# Patient Record
Sex: Male | Born: 1975 | Race: White | Hispanic: No | Marital: Married | Smoking: Current every day smoker
Health system: Southern US, Community
[De-identification: ages and names within clinical notes are randomized; demographics above are authoritative.]

---

## 2009-10-03 ENCOUNTER — Emergency Department (HOSPITAL_COMMUNITY): Admission: EM | Admit: 2009-10-03 | Discharge: 2009-10-03 | Payer: Self-pay | Admitting: Emergency Medicine

## 2010-01-30 ENCOUNTER — Emergency Department (HOSPITAL_COMMUNITY): Admission: EM | Admit: 2010-01-30 | Discharge: 2010-01-30 | Payer: Self-pay | Admitting: Emergency Medicine

## 2010-11-23 ENCOUNTER — Emergency Department (HOSPITAL_COMMUNITY)
Admission: EM | Admit: 2010-11-23 | Discharge: 2010-11-23 | Payer: Self-pay | Source: Home / Self Care | Admitting: Emergency Medicine

## 2011-01-16 ENCOUNTER — Emergency Department (HOSPITAL_COMMUNITY): Payer: Self-pay

## 2011-01-16 ENCOUNTER — Emergency Department (HOSPITAL_COMMUNITY)
Admission: EM | Admit: 2011-01-16 | Discharge: 2011-01-16 | Disposition: A | Discharge status: Home / Self Care | Payer: Self-pay | Attending: Emergency Medicine | Admitting: Emergency Medicine

## 2011-01-16 DIAGNOSIS — R209 Unspecified disturbances of skin sensation: Secondary | ICD-10-CM | POA: Insufficient documentation

## 2011-01-16 DIAGNOSIS — M79609 Pain in unspecified limb: Secondary | ICD-10-CM | POA: Insufficient documentation

## 2013-05-23 ENCOUNTER — Emergency Department (HOSPITAL_COMMUNITY)
Admission: EM | Admit: 2013-05-23 | Discharge: 2013-05-23 | Disposition: A | Discharge status: Home / Self Care | Payer: Self-pay | Attending: Emergency Medicine | Admitting: Emergency Medicine

## 2013-05-23 ENCOUNTER — Encounter (HOSPITAL_COMMUNITY): Payer: Self-pay

## 2013-05-23 DIAGNOSIS — L03113 Cellulitis of right upper limb: Secondary | ICD-10-CM

## 2013-05-23 DIAGNOSIS — F17200 Nicotine dependence, unspecified, uncomplicated: Secondary | ICD-10-CM | POA: Insufficient documentation

## 2013-05-23 DIAGNOSIS — R21 Rash and other nonspecific skin eruption: Secondary | ICD-10-CM | POA: Insufficient documentation

## 2013-05-23 DIAGNOSIS — IMO0002 Reserved for concepts with insufficient information to code with codable children: Secondary | ICD-10-CM | POA: Insufficient documentation

## 2013-05-23 MED ORDER — DIPHENHYDRAMINE HCL 25 MG PO TABS: 25.0000 mg | ORAL_TABLET | Freq: Four times a day (QID) | ORAL | Status: DC

## 2013-05-23 MED ORDER — HYDROCODONE-ACETAMINOPHEN 5-325 MG PO TABS: 1.0000 | ORAL_TABLET | ORAL | Status: DC | PRN

## 2013-05-23 MED ORDER — SULFAMETHOXAZOLE-TRIMETHOPRIM 800-160 MG PO TABS: 1.0000 | ORAL_TABLET | Freq: Two times a day (BID) | ORAL | Status: DC

## 2013-05-23 NOTE — ED Provider Notes (Signed)
   History    CSN: [id number] Arrival date & time 05/23/13  0818  First MD Initiated Contact with Patient 05/23/13 [phone]     Chief Complaint  Patient presents with  . Rash   (Consider location/radiation/quality/duration/timing/severity/associated sxs/prior Treatment) Patient is a 37 y.o. male presenting with rash. The history is provided by the patient.  Rash Pain location: right forearm. Associated symptoms: no chills, no fever, no nausea, no shortness of breath, no sore throat and no vomiting    Charles White is a 37 y.o. male who presents to the ED with a rash. This is a new problem.  The rash is located on the right forearm. The rash started 3 months ago. He describes the rash as raised blistery areas. The areas are painful. He works outside and the sun makes the area worse and causes burning. He has been trying to keep the area covered when he is outside.  He has not been to his PCP for evaluation. He has tried OTC antibiotic cream without relief. He denies nausea or vomiting, fever or chills or any other problems. The area has gotten much worse and more painful. There is redness.  History reviewed. No pertinent past medical history. History reviewed. No pertinent past surgical history. No family history on file. History  Substance Use Topics  . Smoking status: Current Every Day Smoker    Types: Cigarettes  . Smokeless tobacco: Not on file  . Alcohol Use: Yes    Review of Systems  Constitutional: Negative for fever and chills.  HENT: Negative for sore throat.   Respiratory: Negative for shortness of breath.   Gastrointestinal: Negative for nausea and vomiting.  Skin: Positive for rash.  Allergic/Immunologic: Negative for immunocompromised state.  Neurological: Negative for headaches.  Psychiatric/Behavioral: The patient is not nervous/anxious.     Allergies  Review of patient's allergies indicates no known allergies.  Home Medications  No current outpatient  prescriptions on file. BP 138/103  Pulse 65  Temp(Src) 97.7 F (36.5 C) (Oral)  Resp 20  Ht 5\' 11"  (1.803 m)  Wt 240 lb (108.863 kg)  BMI 33.49 kg/m2  SpO2 99% Physical Exam  Nursing note and vitals reviewed. Constitutional: He is oriented to person, place, and time. He appears well-developed and well-nourished. No distress.  HENT:  Head: Normocephalic.  Eyes: EOM are normal.  Neck: Neck supple.  Cardiovascular: Normal rate.   Pulmonary/Chest: Effort normal.  Musculoskeletal:       Right forearm: He exhibits tenderness.       Arms: Vesicular lesions noted dorsal aspect of the right forearm. Tender on exam. There is surrounding erythema consistent with infection.  Neurological: He is alert and oriented to person, place, and time. No cranial nerve deficit.  Skin: Rash (right forearm) noted.  Psychiatric: He has a normal mood and affect. His behavior is normal.    ED Course: Dr. Patria Mane in to see the patient.  Procedures  MDM  37 y.o. male with infected skin lesions right forearm. Will treat with antibiotics and pain management and he will follow up with Dr. Margo Aye.  Discussed with the patient clinical findings and plan of care and all questioned fully answered. He will return if any problems arise.   [address] [place], [place] 05/23/13 [phone]

## 2013-05-23 NOTE — ED Notes (Signed)
Patient with no complaints at this time. Respirations even and unlabored. Skin warm/dry. Discharge instructions reviewed with patient at this time. Patient given opportunity to voice concerns/ask questions. Patient discharged at this time and left Emergency Department with steady gait.   

## 2013-05-23 NOTE — ED Notes (Signed)
Pt reports rash to right forearm for 3 months, has not been to pmd for treatment.

## 2013-05-23 NOTE — ED Provider Notes (Signed)
Medical screening examination/treatment/procedure(s) were conducted as a shared visit with non-physician practitioner(s) and myself.  I personally evaluated the patient during the encounter  Overall well-appearing.  Patient with rash for several months.  Now it appears secondarily infected on his right forearm.  Home with antibiotics.  Lyanne Co, MD 05/23/13 1045

## 2013-09-24 ENCOUNTER — Encounter (HOSPITAL_COMMUNITY): Payer: Self-pay | Admitting: Emergency Medicine

## 2013-09-24 ENCOUNTER — Emergency Department (HOSPITAL_COMMUNITY)
Admission: EM | Admit: 2013-09-24 | Discharge: 2013-09-24 | Disposition: A | Discharge status: Home / Self Care | Payer: Self-pay | Attending: Emergency Medicine | Admitting: Emergency Medicine

## 2013-09-24 DIAGNOSIS — F17200 Nicotine dependence, unspecified, uncomplicated: Secondary | ICD-10-CM | POA: Insufficient documentation

## 2013-09-24 DIAGNOSIS — G5601 Carpal tunnel syndrome, right upper limb: Secondary | ICD-10-CM

## 2013-09-24 DIAGNOSIS — IMO0002 Reserved for concepts with insufficient information to code with codable children: Secondary | ICD-10-CM | POA: Insufficient documentation

## 2013-09-24 DIAGNOSIS — Z792 Long term (current) use of antibiotics: Secondary | ICD-10-CM | POA: Insufficient documentation

## 2013-09-24 DIAGNOSIS — G5600 Carpal tunnel syndrome, unspecified upper limb: Secondary | ICD-10-CM | POA: Insufficient documentation

## 2013-09-24 MED ORDER — PREDNISONE 10 MG PO TABS: ORAL_TABLET | ORAL | Status: DC

## 2013-09-24 MED ORDER — HYDROCODONE-ACETAMINOPHEN 5-325 MG PO TABS: ORAL_TABLET | ORAL | Status: DC

## 2013-09-24 NOTE — ED Notes (Signed)
Pt reports right hand/wrist pain off/on for the last year.  Has been told is carpal tunnel and unable to follow up with specialist.  Pain is worse. No new or recent injury.

## 2013-09-24 NOTE — Progress Notes (Signed)
ED/CM noted patient did not have health insurance and/or PCP listed in the computer.  Patient was given the [facility] with information on the clinics, food pantries, and the handout for new health insurance sign-up.  Patient expressed appreciation for this. Pt also needs to see an orthopedic concerning his painful wrist. Was given the walk-in clinic at [facility] in [place], but pt unsure if he can afford this. Informed he can try [employer], or the [facility] to see if they can offer any kind of assistance for the orthopedic appointment. Pt was also given the financial counselors name and phone number, for assistance with his hospital bill.

## 2013-09-25 NOTE — ED Provider Notes (Signed)
CSN: [id number]     Arrival date & time 09/24/13  4098 History   First MD Initiated Contact with Patient 09/24/13 1012     Chief Complaint  Patient presents with  . Hand Pain  . Arm Pain   (Consider location/radiation/quality/duration/timing/severity/associated sxs/prior Treatment) Patient is a 37 y.o. male presenting with hand pain and arm pain. The history is provided by the patient.  Hand Pain This is a chronic problem. Episode onset: one year ago. The problem occurs constantly. The problem has been gradually worsening. Associated symptoms include arthralgias and numbness. Pertinent negatives include no chills, fever, headaches, joint swelling, myalgias, nausea, neck pain, rash or weakness. Associated symptoms comments: Tingling to his fingers intermittently. Exacerbated by: gripping and excessive use of his hands.  does repetitive movement at work. He has tried nothing for the symptoms. The treatment provided no relief.  Arm Pain Associated symptoms include arthralgias and numbness. Pertinent negatives include no chills, fever, headaches, joint swelling, myalgias, nausea, neck pain, rash or weakness.    History reviewed. No pertinent past medical history. History reviewed. No pertinent past surgical history. No family history on file. History  Substance Use Topics  . Smoking status: Current Every Day Smoker    Types: Cigarettes  . Smokeless tobacco: Not on file  . Alcohol Use: Yes    Review of Systems  Constitutional: Negative for fever and chills.  Gastrointestinal: Negative for nausea.  Genitourinary: Negative for dysuria and difficulty urinating.  Musculoskeletal: Positive for arthralgias. Negative for joint swelling, myalgias and neck pain.  Skin: Negative for color change, rash and wound.  Neurological: Positive for numbness. Negative for weakness and headaches.  All other systems reviewed and are negative.    Allergies  Review of patient's allergies indicates no known  allergies.  Home Medications   Current Outpatient Rx  Name  Route  Sig  Dispense  Refill  . diphenhydrAMINE (BENADRYL) 25 MG tablet   Oral   Take 1 tablet (25 mg total) by mouth every 6 (six) hours.   20 tablet   0   . HYDROcodone-acetaminophen (NORCO/VICODIN) 5-325 MG per tablet   Oral   Take 1 tablet by mouth every 4 (four) hours as needed.   15 tablet   0   . HYDROcodone-acetaminophen (NORCO/VICODIN) 5-325 MG per tablet      Take one-two tabs po q 4-6 hrs prn pain   15 tablet   0   . predniSONE (DELTASONE) 10 MG tablet      Take 6 tablets day one, 5 tablets day two, 4 tablets day three, 3 tablets day four, 2 tablets day five, then 1 tablet day six   21 tablet   0   . sulfamethoxazole-trimethoprim (SEPTRA DS) 800-160 MG per tablet   Oral   Take 1 tablet by mouth 2 (two) times daily.   30 tablet   0    BP 120/86  Pulse 78  Temp(Src) 98 F (36.7 C) (Oral)  Resp 18  Ht 5\' 11"  (1.803 m)  Wt 215 lb (97.523 kg)  BMI 30.00 kg/m2  SpO2 98% Physical Exam  Nursing note and vitals reviewed. Constitutional: He is oriented to person, place, and time. He appears well-developed and well-nourished. No distress.  HENT:  Head: Normocephalic and atraumatic.  Cardiovascular: Normal rate, regular rhythm and normal heart sounds.   Pulmonary/Chest: Effort normal and breath sounds normal. No respiratory distress.  Musculoskeletal: He exhibits tenderness. He exhibits no edema.  Patient describes pain , tingling and numbness  of the right hand and index , middle and ring fingers.  Radial pulse is brisk, distal sensation intact.  CR< 2 sec.  No bruising , erythema, edema or bony deformity.  Patient has full ROM. Compartments soft.  Neurological: He is alert and oriented to person, place, and time. He exhibits normal muscle tone. Coordination normal.  Skin: Skin is warm and dry. No rash noted.    ED Course  Procedures (including critical care time) Labs Review Labs Reviewed - No  data to display Imaging Review No results found.  EKG Interpretation   None       MDM   1. Carpal tunnel syndrome, right     Previous ED charts reviewed.  Localized paraesthesia's the right hand.  No concerning sx's for infection.  NV intact.  Likely carpal tunnel. No hx of trauma to indicate need for imaging at this time  velcro wrist splint applied, pain improved, remains NV intact.  Advised pt that he needs to f/u with orthopedic, referral info given.  Pt appears stable for discharge   Alleigh Mollica L. Trisha Mangle, PA-C 09/25/13 2309

## 2013-09-26 NOTE — ED Provider Notes (Signed)
Medical screening examination/treatment/procedure(s) were performed by non-physician practitioner and as supervising physician I was immediately available for consultation/collaboration.  EKG Interpretation   None         Charles B. Sheldon, MD 09/26/13 2029 

## 2015-09-11 ENCOUNTER — Encounter (HOSPITAL_COMMUNITY): Payer: Self-pay | Admitting: *Deleted

## 2015-09-11 ENCOUNTER — Emergency Department (HOSPITAL_COMMUNITY)
Admission: EM | Admit: 2015-09-11 | Discharge: 2015-09-12 | Discharge status: Against Medical Advice | Payer: Self-pay | Attending: Emergency Medicine | Admitting: Emergency Medicine

## 2015-09-11 DIAGNOSIS — R21 Rash and other nonspecific skin eruption: Secondary | ICD-10-CM | POA: Insufficient documentation

## 2015-09-11 DIAGNOSIS — F17210 Nicotine dependence, cigarettes, uncomplicated: Secondary | ICD-10-CM | POA: Insufficient documentation

## 2015-09-11 DIAGNOSIS — N4889 Other specified disorders of penis: Secondary | ICD-10-CM | POA: Insufficient documentation

## 2015-09-11 DIAGNOSIS — Z792 Long term (current) use of antibiotics: Secondary | ICD-10-CM | POA: Insufficient documentation

## 2015-09-11 DIAGNOSIS — Z79899 Other long term (current) drug therapy: Secondary | ICD-10-CM | POA: Insufficient documentation

## 2015-09-11 MED ORDER — DIPHENHYDRAMINE HCL 25 MG PO TABS: 25.0000 mg | ORAL_TABLET | Freq: Four times a day (QID) | ORAL | Status: DC

## 2015-09-11 MED ORDER — FAMOTIDINE 20 MG PO TABS: 20.0000 mg | ORAL_TABLET | Freq: Two times a day (BID) | ORAL | Status: DC

## 2015-09-11 MED ORDER — DIPHENHYDRAMINE HCL 50 MG/ML IJ SOLN
25.0000 mg | Freq: Once | INTRAMUSCULAR | Status: AC
  Administered 2015-09-11: 25 mg via INTRAMUSCULAR
  Filled 2015-09-11: qty 1

## 2015-09-11 MED ORDER — DEXAMETHASONE SODIUM PHOSPHATE 4 MG/ML IJ SOLN
10.0000 mg | Freq: Once | INTRAMUSCULAR | Status: AC
  Administered 2015-09-11: 10 mg via INTRAMUSCULAR
  Filled 2015-09-11: qty 3

## 2015-09-11 MED ORDER — PREDNISONE 50 MG PO TABS: ORAL_TABLET | ORAL | Status: DC

## 2015-09-11 MED ORDER — FAMOTIDINE 20 MG PO TABS
20.0000 mg | ORAL_TABLET | Freq: Once | ORAL | Status: AC
  Administered 2015-09-11: 20 mg via ORAL
  Filled 2015-09-11: qty 1

## 2015-09-11 NOTE — ED Notes (Addendum)
Pt states he has been in poison ivy and has it all over. Pt states he thinks he has it on his penis. Pt's penis is very swollen.

## 2015-09-11 NOTE — Discharge Instructions (Signed)
Take the steroids and antihistamines as prescribed. Follow up with the urologist. Return to the ED if you develop new or worsening symptoms.

## 2015-09-11 NOTE — ED Provider Notes (Signed)
CSN: [id number]     Arrival date & time 09/11/15  2114 History  By signing my name below, I, Emmanuella Mensah, attest that this documentation has been prepared under the direction and in the presence of Glynn OctaveStephen Enis Leatherwood, MD. Electronically Signed: Angelene GiovanniEmmanuella Mensah, ED Scribe. 09/11/2015. 9:44 PM.    Chief Complaint  Patient presents with  . Groin Swelling   The history is provided by the patient. No language interpreter was used.   HPI Comments: Charles BatmanJohn W White is a 39 y.o. male who presents to the Emergency Department status post possible generalized gradually worsening itchy poison ivy rash that occurred yesterday while he was working pulling trees outside in the woods. He reports associated gradually worsening groin swelling, rash to the penis, and penile swelling onset this am. He denies any pain in his scrotum or testicles. He also denies any fever, vomiting, SOB, or trouble swallowing. He reports that he used Hydrocortisone cream with no relief. He explains that after working in the yard, he went to use the bathroom and touched his penis which could have caused the rash to spread to his penis. He denies any recent injuries. He denies being sexually active with more than one person. He also denies any pertinent medical hx or any daily medications.  No PCP.   History reviewed. No pertinent past medical history. History reviewed. No pertinent past surgical history. History reviewed. No pertinent family history. Social History  Substance Use Topics  . Smoking status: Current Every Day Smoker    Types: Cigarettes  . Smokeless tobacco: None  . Alcohol Use: Yes    Review of Systems  Constitutional: Negative for fever and chills.  HENT: Negative for trouble swallowing.   Respiratory: Negative for shortness of breath.   Genitourinary: Positive for discharge and penile swelling. Negative for scrotal swelling.  Skin: Positive for rash.    A complete 10 system review of systems was  obtained and all systems are negative except as noted in the HPI and PMH.    Allergies  Review of patient's allergies indicates no known allergies.  Home Medications   Prior to Admission medications   Medication Sig Start Date End Date Taking? Authorizing Provider  hydrocortisone cream 1 % Apply 1 application topically once.   Yes Historical Provider, MD  Multiple Vitamin (MULTIVITAMIN WITH MINERALS) TABS tablet Take 1 tablet by mouth daily.   Yes Historical Provider, MD  diphenhydrAMINE (BENADRYL) 25 MG tablet Take 1 tablet (25 mg total) by mouth every 6 (six) hours. 09/11/15   Glynn OctaveStephen Cher Egnor, MD  famotidine (PEPCID) 20 MG tablet Take 1 tablet (20 mg total) by mouth 2 (two) times daily. 09/11/15   Glynn OctaveStephen Analeigha Nauman, MD  HYDROcodone-acetaminophen (NORCO/VICODIN) 5-325 MG per tablet Take 1 tablet by mouth every 4 (four) hours as needed. 05/23/13   Hope Orlene OchM Neese, NP  HYDROcodone-acetaminophen (NORCO/VICODIN) 5-325 MG per tablet Take one-two tabs po q 4-6 hrs prn pain 09/24/13   Tammy Triplett, PA-C  predniSONE (DELTASONE) 50 MG tablet 1 tablet PO daily 09/11/15   Glynn OctaveStephen Daveion Robar, MD  sulfamethoxazole-trimethoprim (SEPTRA DS) 800-160 MG per tablet Take 1 tablet by mouth 2 (two) times daily. 05/23/13   Hope Orlene OchM Neese, NP   BP 164/88 mmHg  Pulse 70  Temp(Src) 98.3 F (36.8 C) (Oral)  Resp 20  Ht 5\' 11"  (1.803 m)  Wt 220 lb (99.791 kg)  BMI 30.70 kg/m2  SpO2 100% Physical Exam  Constitutional: He is oriented to person, place, and time. He appears well-developed  and well-nourished. No distress.  HENT:  Head: Normocephalic and atraumatic.  Mouth/Throat: Oropharynx is clear and moist. No oropharyngeal exudate.  No tongue or lip swelling  Eyes: Conjunctivae and EOM are normal. Pupils are equal, round, and reactive to light.  Neck: Normal range of motion. Neck supple.  No meningismus.  Cardiovascular: Normal rate, regular rhythm, normal heart sounds and intact distal pulses.   No murmur  heard. Pulmonary/Chest: Effort normal and breath sounds normal. No respiratory distress.  Abdominal: Soft. There is no tenderness. There is no rebound and no guarding.  Genitourinary:  Scatterred erythematous rash to inguinal folds and waist line, suspicious for cardida  He has edema to head of penis and distal shaft.  Testicles are non tender circumsized.  Musculoskeletal: Normal range of motion. He exhibits no edema or tenderness.  Neurological: He is alert and oriented to person, place, and time. No cranial nerve deficit. He exhibits normal muscle tone. Coordination normal.  No ataxia on finger to nose bilaterally. No pronator drift. 5/5 strength throughout. CN 2-12 intact. Negative Romberg. Equal grip strength. Sensation intact. Gait is normal.   Skin: Skin is warm.  Scattered maculopapular rash to bilateral arms.   Psychiatric: He has a normal mood and affect. His behavior is normal.  Nursing note and vitals reviewed.   ED Course  Procedures (including critical care time) DIAGNOSTIC STUDIES: Oxygen Saturation is 100% on RA, normal by my interpretation.    COORDINATION OF CARE: 9:40 PM- Pt advised of plan for treatment and pt agrees. Pt will receive steroids and antihistamines to help with swelling.     EKG Interpretation None      MDM   Final diagnoses:  Penile swelling   1 day history of swelling to glands and distal penis after exposure to what he thinks is poison ivy. Denies any testicular pain. No difficulty breathing or swallowing. Still able to urinate.  Patient given steroids, antihistamines He does have areas of rash on his arms which are likely some form of dermatitis.  Given acute onset of symptoms, suspect allergic reaction rather than infection.  He is circumsized. No phimosis or paraphimosis.  Patient with minimal improvement in swelling after meds. He is insisting that he needs to leave to pick of his child.  He feels he is doing better. Has not urinated  but states he is able to.  D/w patient that he could be at risk for worsening swelling which could cause damage to his penis including impotence or inability to urinate. He states he cannot wait and will leave AMA. He appears to have capacity to make medical decisions.  I personally performed the services described in this documentation, which was scribed in my presence. The recorded information has been reviewed and is accurate.   Glynn Octave, MD 09/12/15 1257

## 2015-09-11 NOTE — ED Notes (Signed)
Patient states that he needs to leave and go pick up his child. States that he is unable to urinate and that he urinated prior to coming to the ER. States that he knows he can urinate and that he needs to leave. MD aware and will come and talk to him in a few minutes.

## 2015-09-12 NOTE — ED Notes (Signed)
Patient told the tech that he was leaving. Would not wait. Left without his prescriptions.

## 2017-06-24 ENCOUNTER — Encounter (HOSPITAL_COMMUNITY): Payer: Self-pay | Admitting: *Deleted

## 2017-06-24 ENCOUNTER — Emergency Department (HOSPITAL_COMMUNITY): Payer: Self-pay

## 2017-06-24 ENCOUNTER — Emergency Department (HOSPITAL_COMMUNITY)
Admission: EM | Admit: 2017-06-24 | Discharge: 2017-06-24 | Disposition: A | Discharge status: Home / Self Care | Payer: Self-pay | Attending: Emergency Medicine | Admitting: Emergency Medicine

## 2017-06-24 DIAGNOSIS — Z79899 Other long term (current) drug therapy: Secondary | ICD-10-CM | POA: Insufficient documentation

## 2017-06-24 DIAGNOSIS — X58XXXA Exposure to other specified factors, initial encounter: Secondary | ICD-10-CM | POA: Insufficient documentation

## 2017-06-24 DIAGNOSIS — Z23 Encounter for immunization: Secondary | ICD-10-CM | POA: Insufficient documentation

## 2017-06-24 DIAGNOSIS — F17210 Nicotine dependence, cigarettes, uncomplicated: Secondary | ICD-10-CM | POA: Insufficient documentation

## 2017-06-24 DIAGNOSIS — S91115A Laceration without foreign body of left lesser toe(s) without damage to nail, initial encounter: Secondary | ICD-10-CM | POA: Insufficient documentation

## 2017-06-24 DIAGNOSIS — Y929 Unspecified place or not applicable: Secondary | ICD-10-CM | POA: Insufficient documentation

## 2017-06-24 DIAGNOSIS — Y9389 Activity, other specified: Secondary | ICD-10-CM | POA: Insufficient documentation

## 2017-06-24 DIAGNOSIS — Y999 Unspecified external cause status: Secondary | ICD-10-CM | POA: Insufficient documentation

## 2017-06-24 MED ORDER — CEPHALEXIN 500 MG PO CAPS
500.0000 mg | ORAL_CAPSULE | Freq: Once | ORAL | Status: AC
  Administered 2017-06-24: 500 mg via ORAL
  Filled 2017-06-24: qty 1

## 2017-06-24 MED ORDER — TETANUS-DIPHTH-ACELL PERTUSSIS 5-2.5-18.5 LF-MCG/0.5 IM SUSP
0.5000 mL | Freq: Once | INTRAMUSCULAR | Status: AC
  Administered 2017-06-24: 0.5 mL via INTRAMUSCULAR
  Filled 2017-06-24: qty 0.5

## 2017-06-24 MED ORDER — LIDOCAINE HCL (PF) 1 % IJ SOLN
5.0000 mL | Freq: Once | INTRAMUSCULAR | Status: AC
  Administered 2017-06-24: 5 mL
  Filled 2017-06-24: qty 5

## 2017-06-24 MED ORDER — CEPHALEXIN 500 MG PO CAPS: 500.0000 mg | ORAL_CAPSULE | Freq: Four times a day (QID) | ORAL | 0 refills | Status: DC

## 2017-06-24 NOTE — Discharge Instructions (Signed)
Stitches need to be removed in 7 days. That can be done here, at an urgent care center, or at a doctor's office.  You did not allow me to do an x-ray, so I don't know if the toe is broken, or if there is a foreign body there that I couldn't see.  Watch for signs of infection - if any appear, then return here.
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
## 2017-06-24 NOTE — ED Notes (Addendum)
William from xray came into pt's room to transport pt to xray, pt became upset because Chrissie NoaWilliam asked about the type of injury. Pt walked out to nursing desk, refusing xray, continues to argue about why he has to "tell his story" , security called for assistance, pt escorted back to room by security, Dr Preston FleetingGlick notified,
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
## 2017-06-24 NOTE — ED Notes (Signed)
Pt refuses xray.  Pt is pleasant and following commands.

## 2017-09-23 ENCOUNTER — Emergency Department (HOSPITAL_COMMUNITY): Payer: Self-pay

## 2017-09-23 ENCOUNTER — Encounter (HOSPITAL_COMMUNITY): Payer: Self-pay

## 2017-09-23 ENCOUNTER — Other Ambulatory Visit: Payer: Self-pay

## 2017-09-23 ENCOUNTER — Emergency Department (HOSPITAL_COMMUNITY)
Admission: EM | Admit: 2017-09-23 | Discharge: 2017-09-23 | Disposition: A | Discharge status: Home / Self Care | Payer: Self-pay | Attending: Emergency Medicine | Admitting: Emergency Medicine

## 2017-09-23 DIAGNOSIS — F419 Anxiety disorder, unspecified: Secondary | ICD-10-CM | POA: Insufficient documentation

## 2017-09-23 DIAGNOSIS — F17210 Nicotine dependence, cigarettes, uncomplicated: Secondary | ICD-10-CM | POA: Insufficient documentation

## 2017-09-23 DIAGNOSIS — T7840XA Allergy, unspecified, initial encounter: Secondary | ICD-10-CM | POA: Insufficient documentation

## 2017-09-23 LAB — COMPREHENSIVE METABOLIC PANEL
ALK PHOS: 41 U/L (ref 38–126)
ALT: 29 U/L (ref 17–63)
AST: 34 U/L (ref 15–41)
Albumin: 4.6 g/dL (ref 3.5–5.0)
Anion gap: 10 (ref 5–15)
BILIRUBIN TOTAL: 0.8 mg/dL (ref 0.3–1.2)
BUN: 18 mg/dL (ref 6–20)
CALCIUM: 9.3 mg/dL (ref 8.9–10.3)
CO2: 24 mmol/L (ref 22–32)
CREATININE: 0.94 mg/dL (ref 0.61–1.24)
Chloride: 104 mmol/L (ref 101–111)
Glucose, Bld: 101 mg/dL — ABNORMAL HIGH (ref 65–99)
Potassium: 3.6 mmol/L (ref 3.5–5.1)
SODIUM: 138 mmol/L (ref 135–145)
TOTAL PROTEIN: 7.2 g/dL (ref 6.5–8.1)

## 2017-09-23 LAB — CBC WITH DIFFERENTIAL/PLATELET
Basophils Absolute: 0 10*3/uL (ref 0.0–0.1)
Basophils Relative: 0 %
EOS ABS: 0.3 10*3/uL (ref 0.0–0.7)
Eosinophils Relative: 2 %
HEMATOCRIT: 43.3 % (ref 39.0–52.0)
HEMOGLOBIN: 14.9 g/dL (ref 13.0–17.0)
LYMPHS ABS: 3.3 10*3/uL (ref 0.7–4.0)
LYMPHS PCT: 26 %
MCH: 28.9 pg (ref 26.0–34.0)
MCHC: 34.4 g/dL (ref 30.0–36.0)
MCV: 83.9 fL (ref 78.0–100.0)
MONOS PCT: 8 %
Monocytes Absolute: 1 10*3/uL (ref 0.1–1.0)
NEUTROS PCT: 64 %
Neutro Abs: 8 10*3/uL — ABNORMAL HIGH (ref 1.7–7.7)
Platelets: 278 10*3/uL (ref 150–400)
RBC: 5.16 MIL/uL (ref 4.22–5.81)
RDW: 12.5 % (ref 11.5–15.5)
WBC: 12.7 10*3/uL — ABNORMAL HIGH (ref 4.0–10.5)

## 2017-09-23 LAB — TROPONIN I: Troponin I: <0.03 ng/mL (ref <0.03)

## 2017-09-23 MED ORDER — GI COCKTAIL ~~LOC~~
30.0000 mL | Freq: Once | ORAL | Status: AC
  Administered 2017-09-23: 30 mL via ORAL
  Filled 2017-09-23: qty 30

## 2017-09-23 MED ORDER — METHYLPREDNISOLONE SODIUM SUCC 125 MG IJ SOLR
125.0000 mg | Freq: Once | INTRAMUSCULAR | Status: AC
  Administered 2017-09-23: 125 mg via INTRAVENOUS
  Filled 2017-09-23: qty 2

## 2017-09-23 MED ORDER — PREDNISONE 10 MG PO TABS: 20.0000 mg | ORAL_TABLET | Freq: Every day | ORAL | 0 refills | Status: DC

## 2017-09-23 MED ORDER — DIPHENHYDRAMINE HCL 50 MG/ML IJ SOLN
25.0000 mg | Freq: Once | INTRAMUSCULAR | Status: AC
  Administered 2017-09-23: 25 mg via INTRAVENOUS
  Filled 2017-09-23: qty 1

## 2017-09-23 MED ORDER — TRAMADOL HCL 50 MG PO TABS: 50.0000 mg | ORAL_TABLET | Freq: Four times a day (QID) | ORAL | 0 refills | Status: DC | PRN

## 2017-09-23 MED ORDER — FAMOTIDINE 20 MG PO TABS: 20.0000 mg | ORAL_TABLET | Freq: Two times a day (BID) | ORAL | 0 refills | Status: DC

## 2017-09-23 MED ORDER — FAMOTIDINE IN NACL 20-0.9 MG/50ML-% IV SOLN
20.0000 mg | Freq: Once | INTRAVENOUS | Status: AC
  Administered 2017-09-23: 20 mg via INTRAVENOUS
  Filled 2017-09-23: qty 50

## 2017-09-23 NOTE — ED Notes (Signed)
To rad 

## 2017-09-23 NOTE — ED Notes (Signed)
Dr Z in to reassess and discuss findings 

## 2017-09-23 NOTE — ED Notes (Signed)
Rash has mostly resolved on chest and arms, face remains reddened   Pt reports he works 2 jobs and one is in a [employer]

## 2017-09-23 NOTE — ED Notes (Signed)
Pt angry and distressed that he has not been given a definitive dx  States  "I've been here and you all can't even tell me what is going on"  Pt has no physician and is educated regarding [facility] as well as health department as the Mental health agencies  He became irate when mental health was mentioned and declared that he was anxious and panicked because of his pain.   He jumped off of the stretcher, and left with his SO

## 2017-09-23 NOTE — ED Provider Notes (Signed)
[facility] EMERGENCY DEPARTMENT Provider Note   CSN: [id number] Arrival date & time: 09/23/17  1309     History   Chief Complaint Chief Complaint  Patient presents with  . Anxiety  . Rash    HPI Charles White is a 41 y.o. male.  Patient complains of rash swelling to face and arms. He also states that he has been having some chest discomfort is relieved by inspiration   The history is provided by the patient. No language interpreter was used.  Rash   This is a new problem. The current episode started more than 2 days ago. The problem has not changed since onset.The problem is associated with nothing. There has been no fever. The rash is present on the torso. The pain is at a severity of 4/10. The pain is moderate. The pain has been fluctuating since onset.    History reviewed. No pertinent past medical history.  There are no active problems to display for this patient.   History reviewed. No pertinent surgical history.     Home Medications    Prior to Admission medications   Medication Sig Start Date End Date Taking? Authorizing Provider  famotidine (PEPCID) 20 MG tablet Take 1 tablet (20 mg total) 2 (two) times daily by mouth. 09/23/17   Bethann Berkshire, MD  HYDROcodone-acetaminophen (NORCO/VICODIN) 5-325 MG per tablet Take one-two tabs po q 4-6 hrs prn pain Patient not taking: Reported on 09/23/2017 09/24/13   Triplett, Tammy, PA-C  predniSONE (DELTASONE) 10 MG tablet Take 2 tablets (20 mg total) daily by mouth. 09/23/17   Bethann Berkshire, MD  traMADol (ULTRAM) 50 MG tablet Take 1 tablet (50 mg total) every 6 (six) hours as needed by mouth. 09/23/17   Bethann Berkshire, MD    Family History No family history on file.  Social History Social History   Tobacco Use  . Smoking status: Current Every Day Smoker    Types: Cigarettes  . Smokeless tobacco: Never Used  Substance Use Topics  . Alcohol use: Yes  . Drug use: No     Allergies   Patient has no known  allergies.   Review of Systems Review of Systems  Constitutional: Negative for appetite change and fatigue.  HENT: Negative for congestion, ear discharge and sinus pressure.   Eyes: Negative for discharge.  Respiratory: Negative for cough.   Cardiovascular: Negative for chest pain.  Gastrointestinal: Negative for abdominal pain and diarrhea.  Genitourinary: Negative for frequency and hematuria.  Musculoskeletal: Negative for back pain.  Skin: Positive for rash.  Neurological: Negative for seizures and headaches.  Psychiatric/Behavioral: Negative for hallucinations.     Physical Exam Updated Vital Signs BP (!) 150/119 (BP Location: Left Arm)   Pulse 95   Temp 98.4 F (36.9 C) (Oral)   Resp 18   Ht 5\' 11"  (1.803 m)   Wt 95.3 kg (210 lb 3.2 oz)   SpO2 99%   BMI 29.32 kg/m   Physical Exam  Constitutional: He is oriented to person, place, and time. He appears well-developed.  HENT:  Head: Normocephalic.  Eyes: Conjunctivae and EOM are normal. No scleral icterus.  Neck: Neck supple. No thyromegaly present.  Cardiovascular: Normal rate and regular rhythm. Exam reveals no gallop and no friction rub.  No murmur heard. Pulmonary/Chest: No stridor. He has no wheezes. He has no rales. He exhibits no tenderness.  Abdominal: He exhibits no distension. There is no tenderness. There is no rebound.  Musculoskeletal: Normal range of motion. He  exhibits no edema.  Lymphadenopathy:    He has no cervical adenopathy.  Neurological: He is oriented to person, place, and time. He exhibits normal muscle tone. Coordination normal.  Skin: No rash noted. No erythema.  Patient has an urticarial rash present on his torso face and arm  Psychiatric: He has a normal mood and affect. His behavior is normal.     ED Treatments / Results  Labs (all labs ordered are listed, but only abnormal results are displayed) Labs Reviewed  CBC WITH DIFFERENTIAL/PLATELET - Abnormal; Notable for the following  components:      Result Value   WBC 12.7 (*)    Neutro Abs 8.0 (*)    All other components within normal limits  COMPREHENSIVE METABOLIC PANEL - Abnormal; Notable for the following components:   Glucose, Bld 101 (*)    All other components within normal limits  TROPONIN I    EKG  EKG Interpretation  Date/Time:  Sunday September 23 2017 13:46:19 EST Ventricular Rate:  74 PR Interval:    QRS Duration: 84 QT Interval:  391 QTC Calculation: 434 R Axis:   67 Text Interpretation:  Sinus rhythm Confirmed by Bethann BerkshireZammit, Dosha Broshears [phone]([id number]) on 09/23/2017 4:27:58 PM       Radiology Dg Chest 2 View  Result Date: 09/23/2017 CLINICAL DATA:  41 year old male with facial swelling, hives, difficulty swallowing and chest pressure for 1 month. EXAM: CHEST  2 VIEW COMPARISON:  None. FINDINGS: The heart size and mediastinal contours are within normal limits. Both lungs are clear. The visualized skeletal structures are unremarkable. IMPRESSION: No active cardiopulmonary disease. Electronically Signed   By: Sande BrothersSerena  Chacko M.D.   On: 09/23/2017 15:39    Procedures Procedures (including critical care time)  Medications Ordered in ED Medications  diphenhydrAMINE (BENADRYL) injection 25 mg (25 mg Intravenous Given 09/23/17 1421)  methylPREDNISolone sodium succinate (SOLU-MEDROL) 125 mg/2 mL injection 125 mg (125 mg Intravenous Given 09/23/17 1423)  famotidine (PEPCID) IVPB 20 mg premix (0 mg Intravenous Stopped 09/23/17 1507)  gi cocktail (Maalox,Lidocaine,Donnatal) (30 mLs Oral Given 09/23/17 1420)     Initial Impression / Assessment and Plan / ED Course  I have reviewed the triage vital signs and the nursing notes.  Pertinent labs & imaging results that were available during my care of the patient were reviewed by me and considered in my medical decision making (see chart for details).    Patient with allergic reaction patient improved with treatment. He will be sent home with prednisone Pepcid and will  take Benadryl. He also has been instructed to see her family doctor about his blood pressure  Final Clinical Impressions(s) / ED Diagnoses   Final diagnoses:  Allergic reaction, initial encounter    New Prescriptions This SmartLink is deprecated. Use AVSMEDLIST instead to display the medication list for a patient.   Bethann BerkshireZammit, Zamyra Allensworth, MD 09/23/17 [phone]

## 2017-09-23 NOTE — ED Notes (Signed)
Pt reports never having seen a physician, nor out [t therapy for his anxiety  Reports that he has a rash, gets anxious and then has a full blown panic attack  He presents with a wheal and flare rash to his chest, redness to his face and arms  He has taken no meds

## 2017-09-23 NOTE — ED Triage Notes (Signed)
Patient reports of swelling to face/redness, hives, difficulty swallowing and chest pressure x1 month. States he has been under stress with family. States pain in chest goes away when he takes a deep breath. States this morning he had a panic attack and family had to hold him down. Patient anxious in triage.

## 2017-09-23 NOTE — Discharge Instructions (Signed)
Take Benadryl every 6 hours for rash or swelling. Follow-up with family doctor to recheck here rash and also to follow-up on your elevated blood pressure

## 2018-07-03 ENCOUNTER — Emergency Department (HOSPITAL_COMMUNITY)
Admission: EM | Admit: 2018-07-03 | Discharge: 2018-07-03 | Disposition: A | Discharge status: Home / Self Care | Payer: Self-pay | Attending: Emergency Medicine | Admitting: Emergency Medicine

## 2018-07-03 ENCOUNTER — Encounter (HOSPITAL_COMMUNITY): Payer: Self-pay | Admitting: Emergency Medicine

## 2018-07-03 ENCOUNTER — Other Ambulatory Visit: Payer: Self-pay

## 2018-07-03 ENCOUNTER — Emergency Department (HOSPITAL_COMMUNITY): Payer: Self-pay

## 2018-07-03 DIAGNOSIS — M25561 Pain in right knee: Secondary | ICD-10-CM | POA: Insufficient documentation

## 2018-07-03 DIAGNOSIS — F17210 Nicotine dependence, cigarettes, uncomplicated: Secondary | ICD-10-CM | POA: Insufficient documentation

## 2018-07-03 DIAGNOSIS — Z79899 Other long term (current) drug therapy: Secondary | ICD-10-CM | POA: Insufficient documentation

## 2018-07-03 MED ORDER — IBUPROFEN 800 MG PO TABS
800.0000 mg | ORAL_TABLET | Freq: Once | ORAL | Status: AC
  Administered 2018-07-03: 800 mg via ORAL
  Filled 2018-07-03: qty 1

## 2018-07-03 MED ORDER — OXYCODONE-ACETAMINOPHEN 5-325 MG PO TABS
1.0000 | ORAL_TABLET | Freq: Once | ORAL | Status: AC
Start: 2018-07-03 — End: 2018-07-03
  Administered 2018-07-03: 1 via ORAL
  Filled 2018-07-03: qty 1

## 2018-07-03 MED ORDER — OXYCODONE-ACETAMINOPHEN 5-325 MG PO TABS: 1.0000 | ORAL_TABLET | ORAL | 0 refills | Status: DC | PRN

## 2018-07-03 MED ORDER — IBUPROFEN 800 MG PO TABS: 800.0000 mg | ORAL_TABLET | Freq: Three times a day (TID) | ORAL | 0 refills | Status: DC

## 2018-07-03 NOTE — ED Provider Notes (Signed)
[facility] EMERGENCY DEPARTMENT Provider Note   CSN: [id number] Arrival date & time: 07/03/18  1635     History   Chief Complaint Chief Complaint  Patient presents with  . Knee Pain    HPI Charles White is a 42 y.o. male.  HPI   Charles White is a 42 y.o. male who presents to the Emergency Department complaining of sudden onset of right knee pain earlier today while at work.  He states that he stepped over a gate and felt a sharp pain to the back of his right knee.  Since that time he has been unable to bear weight or extend the knee.  He denies fall.  He also denies numbness or tingling of the extremity or other concerns.  Pain resolves when the knee is held in flexion.  He has not tried any therapies prior to arrival.  He reports history of recurrent pain of the affected knee.   History reviewed. No pertinent past medical history.  There are no active problems to display for this patient.   History reviewed. No pertinent surgical history.      Home Medications    Prior to Admission medications   Medication Sig Start Date End Date Taking? Authorizing Provider  famotidine (PEPCID) 20 MG tablet Take 1 tablet (20 mg total) 2 (two) times daily by mouth. 09/23/17   Bethann BerkshireZammit, Joseph, MD  HYDROcodone-acetaminophen (NORCO/VICODIN) 5-325 MG per tablet Take one-two tabs po q 4-6 hrs prn pain Patient not taking: Reported on 09/23/2017 09/24/13   Evangelia Whitaker, PA-C  ibuprofen (ADVIL,MOTRIN) 800 MG tablet Take 1 tablet (800 mg total) by mouth 3 (three) times daily. 07/03/18   Alvis Pulcini, PA-C  oxyCODONE-acetaminophen (PERCOCET/ROXICET) 5-325 MG tablet Take 1 tablet by mouth every 4 (four) hours as needed. 07/03/18   Haiden Rawlinson, PA-C  predniSONE (DELTASONE) 10 MG tablet Take 2 tablets (20 mg total) daily by mouth. 09/23/17   Bethann BerkshireZammit, Joseph, MD  traMADol (ULTRAM) 50 MG tablet Take 1 tablet (50 mg total) every 6 (six) hours as needed by mouth. 09/23/17   Bethann BerkshireZammit, Joseph, MD     Family History History reviewed. No pertinent family history.  Social History Social History   Tobacco Use  . Smoking status: Current Every Day Smoker    Types: Cigarettes  . Smokeless tobacco: Never Used  Substance Use Topics  . Alcohol use: Yes    Comment: weekends.   . Drug use: No     Allergies   Patient has no known allergies.   Review of Systems Review of Systems  Constitutional: Negative for chills and fever.  Gastrointestinal: Negative for nausea and vomiting.  Musculoskeletal: Positive for arthralgias (Right knee pain). Negative for back pain and joint swelling.  Skin: Negative for color change and wound.  Neurological: Negative for weakness and numbness.  All other systems reviewed and are negative.    Physical Exam Updated Vital Signs BP (!) 145/93 (BP Location: Right Arm)   Pulse 89   Temp 98.7 F (37.1 C) (Temporal)   Resp 18   Wt 99.8 kg   SpO2 100%   BMI 30.68 kg/m   Physical Exam  Constitutional: He is oriented to person, place, and time. He appears well-developed and well-nourished. No distress.  HENT:  Head: Atraumatic.  Cardiovascular: Normal rate, regular rhythm and intact distal pulses.  Pulmonary/Chest: Effort normal and breath sounds normal.  Musculoskeletal: He exhibits edema and tenderness.  Tenderness to palpation of the right popliteal fossa with  tenderness extending into the right calf muscle.  Calf muscle does appear edematous.  Mildly positive Thompson sign. no erythema or excessive warmth.  Hamstring and Achilles tendon appear intact.  No effusion or crepitus of the anterior knee.  Neurological: He is alert and oriented to person, place, and time. He exhibits normal muscle tone. Coordination normal.  Skin: Skin is warm and dry. Capillary refill takes less than 2 seconds. No erythema.  Nursing note and vitals reviewed.    ED Treatments / Results  Labs (all labs ordered are listed, but only abnormal results are  displayed) Labs Reviewed - No data to display  EKG None  Radiology Dg Knee Complete 4 Views Right  Result Date: 07/03/2018 CLINICAL DATA:  Twisted the knee EXAM: RIGHT KNEE - COMPLETE 4+ VIEW COMPARISON:  None. FINDINGS: No fracture or malalignment. No significant knee effusion. Joint spaces are maintained. IMPRESSION: No acute osseous abnormality. Electronically Signed   By: Jasmine PangKim  Fujinaga M.D.   On: 07/03/2018 17:25    Procedures Procedures (including critical care time)  Medications Ordered in ED Medications  oxyCODONE-acetaminophen (PERCOCET/ROXICET) 5-325 MG per tablet 1 tablet (1 tablet Oral Given 07/03/18 1751)  ibuprofen (ADVIL,MOTRIN) tablet 800 mg (800 mg Oral Given 07/03/18 1751)     Initial Impression / Assessment and Plan / ED Course  I have reviewed the triage vital signs and the nursing notes.  Pertinent labs & imaging results that were available during my care of the patient were reviewed by me and considered in my medical decision making (see chart for details).     X-ray of right knee negative for bony deformity or effusion.  Slightly positive Thompson sign on the right concerning for injury of the right calf muscles.  Neurovascularly intact.  Patient given knee sleeve and immobilizer as well as crutches.  He agrees to treatment plan with rest, compression, and crutches for weightbearing.  He prefers local orthopedic follow-up.  Final Clinical Impressions(s) / ED Diagnoses   Final diagnoses:  Acute pain of right knee    ED Discharge Orders         Ordered    oxyCODONE-acetaminophen (PERCOCET/ROXICET) 5-325 MG tablet  Every 4 hours PRN     07/03/18 1834    ibuprofen (ADVIL,MOTRIN) 800 MG tablet  3 times daily     07/03/18 1834           Pauline Ausriplett, Narelle Schoening, PA-C 07/03/18 [id number]    Cook, Brian, MD 07/08/18 1116

## 2018-07-03 NOTE — Discharge Instructions (Addendum)
Apply ice packs on and off to your knee and calf.  Wear the knee brace as needed for support, but do not wear continuously or at bedtime.  Call 1 of the orthopedic providers listed to arrange a follow-up appointment in a few days if not improving.  He will need to use the crutches for weightbearing.

## 2018-07-03 NOTE — ED Triage Notes (Signed)
Pt twisted right knee today. Nad.

## 2019-08-21 IMAGING — DX DG KNEE COMPLETE 4+V*R*
4 series · 4 of 4 positions shown · non-contrast
Comparison: None.

CLINICAL DATA: Twisted the knee

EXAM:
RIGHT KNEE - COMPLETE 4+ VIEW

[knee ap]
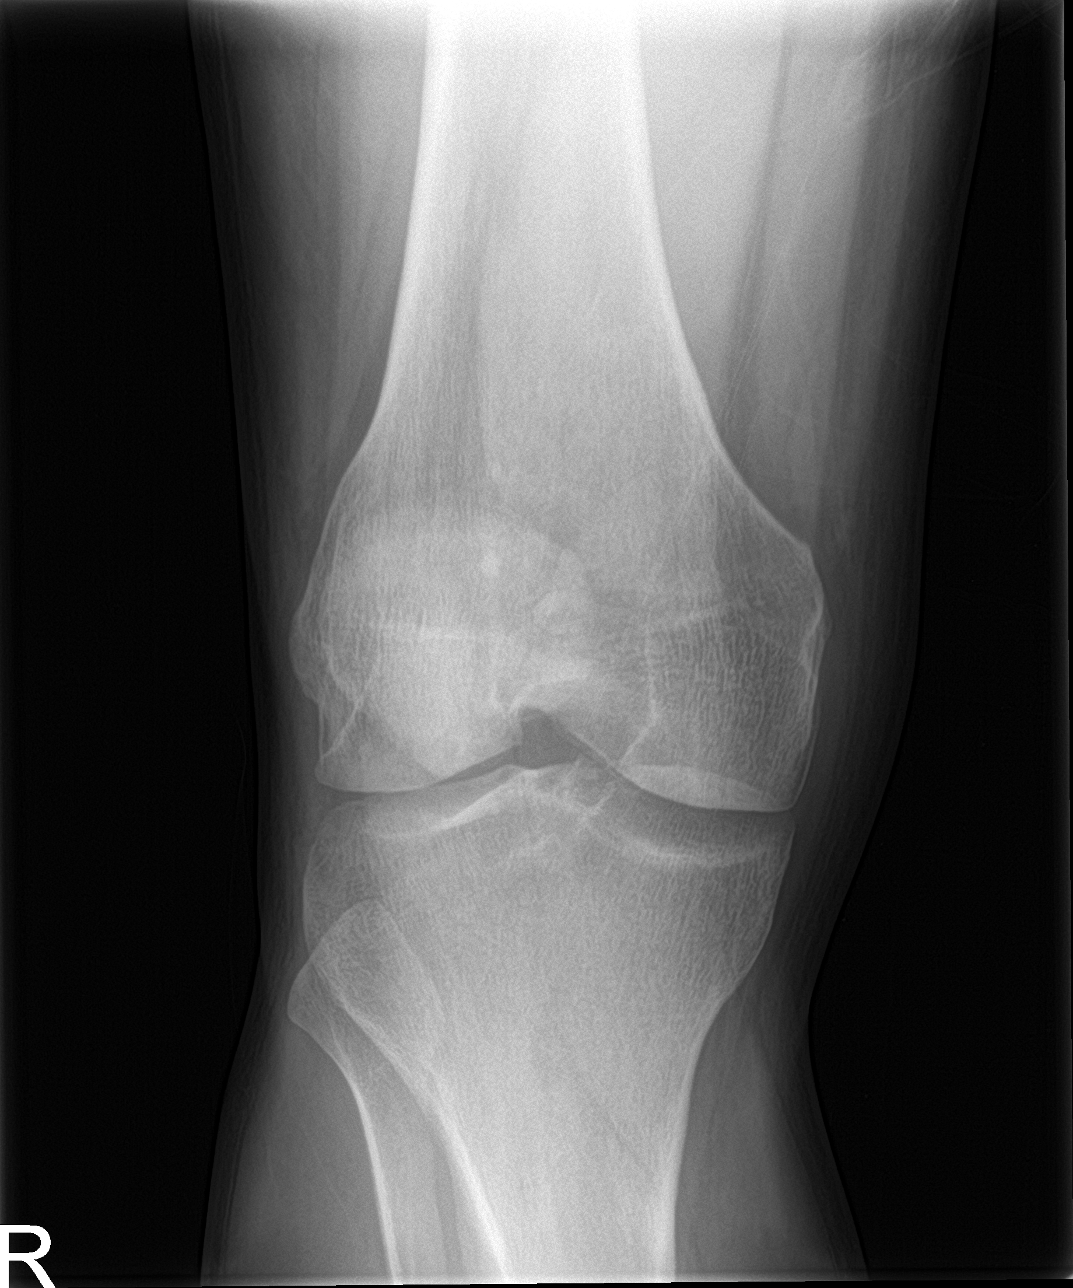

[tunnel]
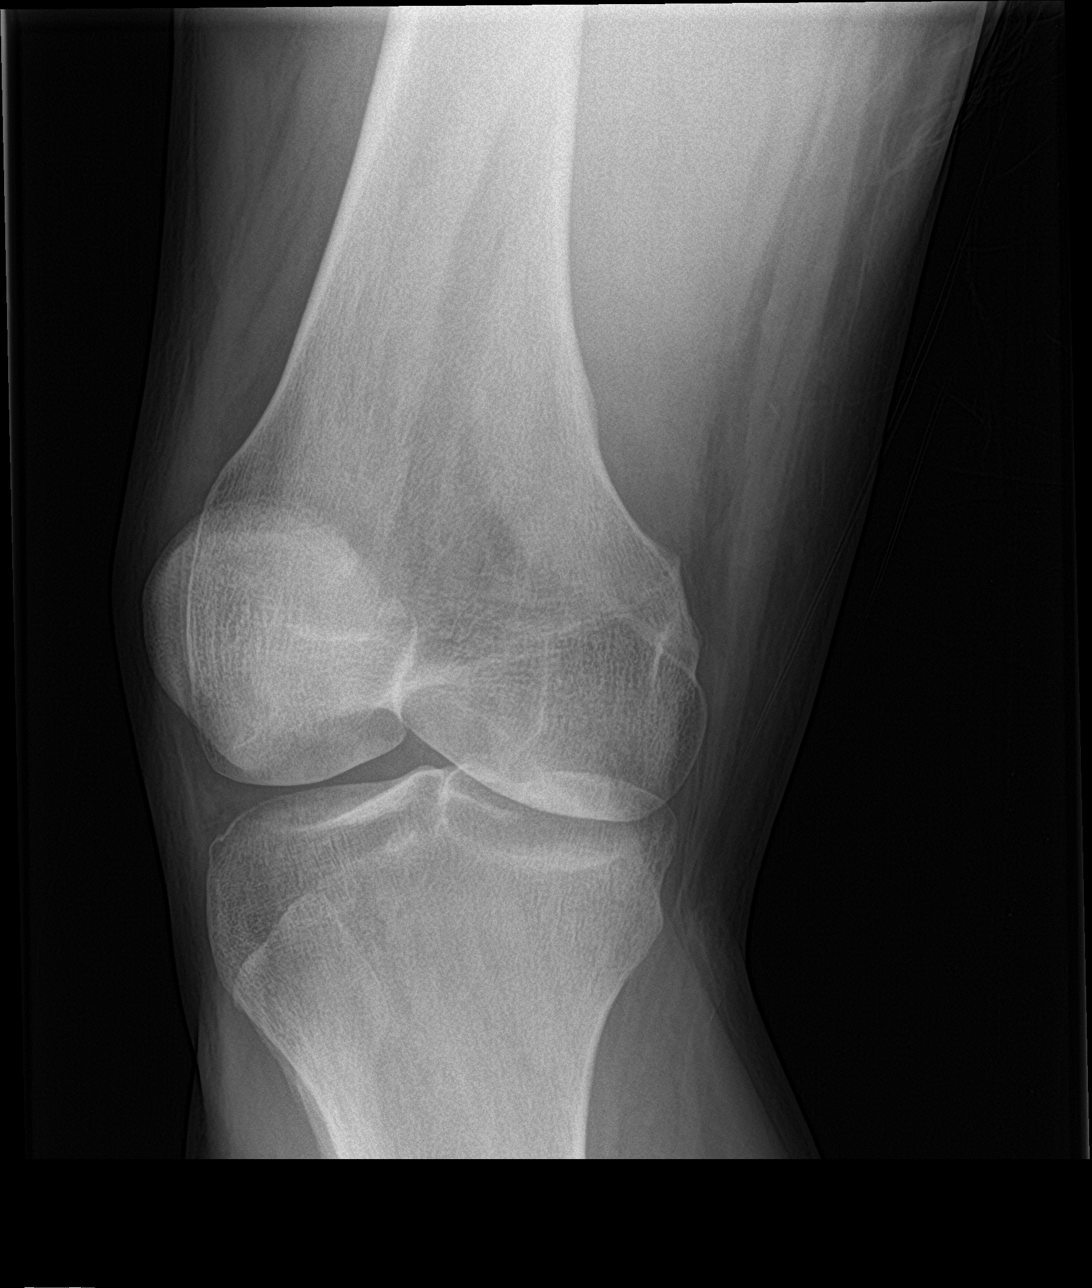

[knee lat (1 of 2)]
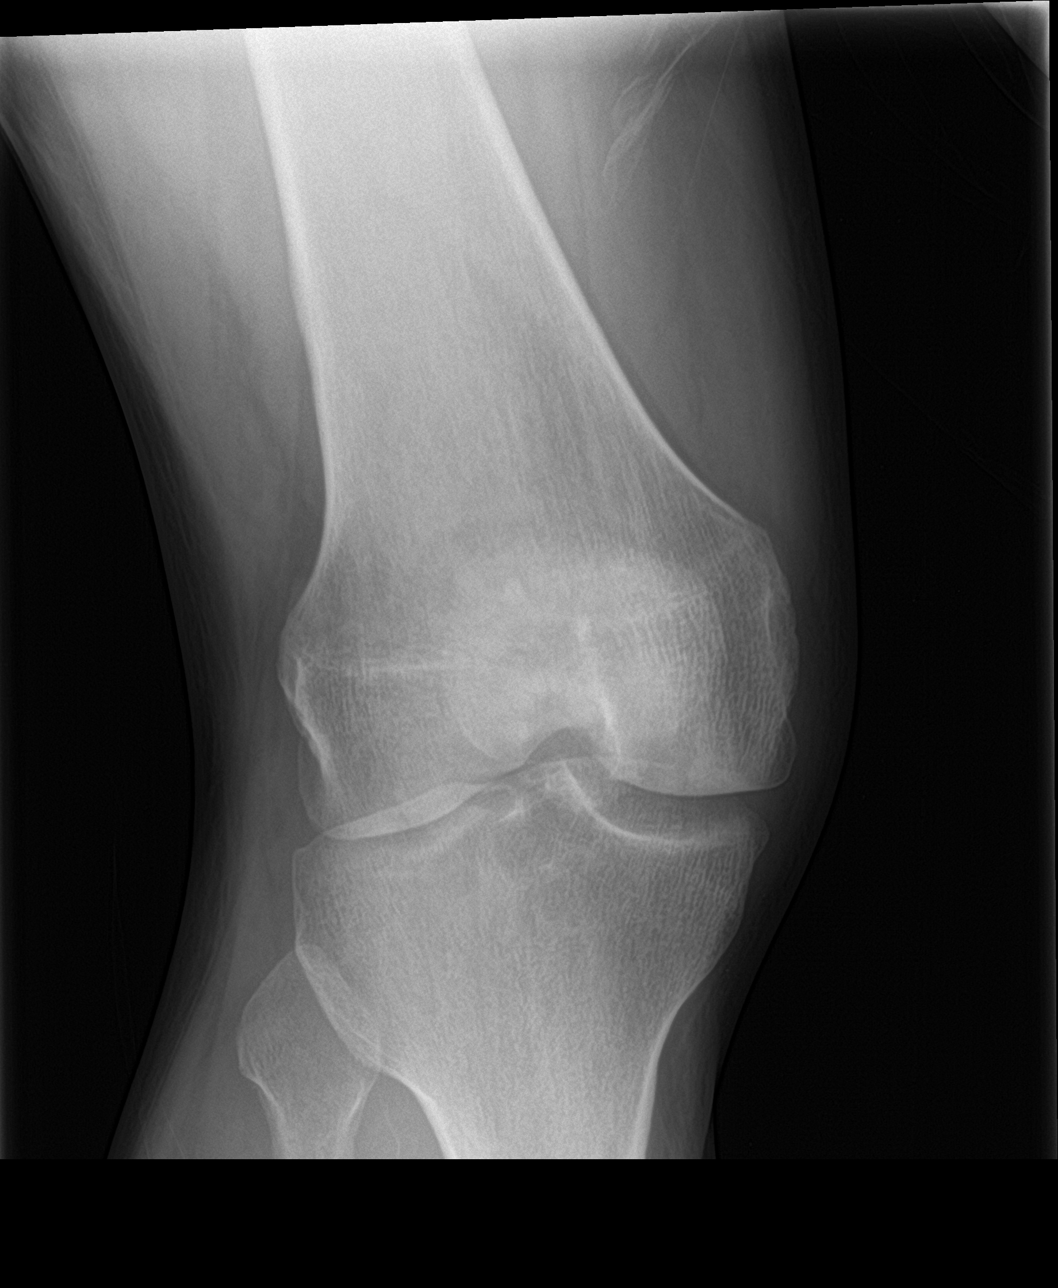

[knee lat (2 of 2)]
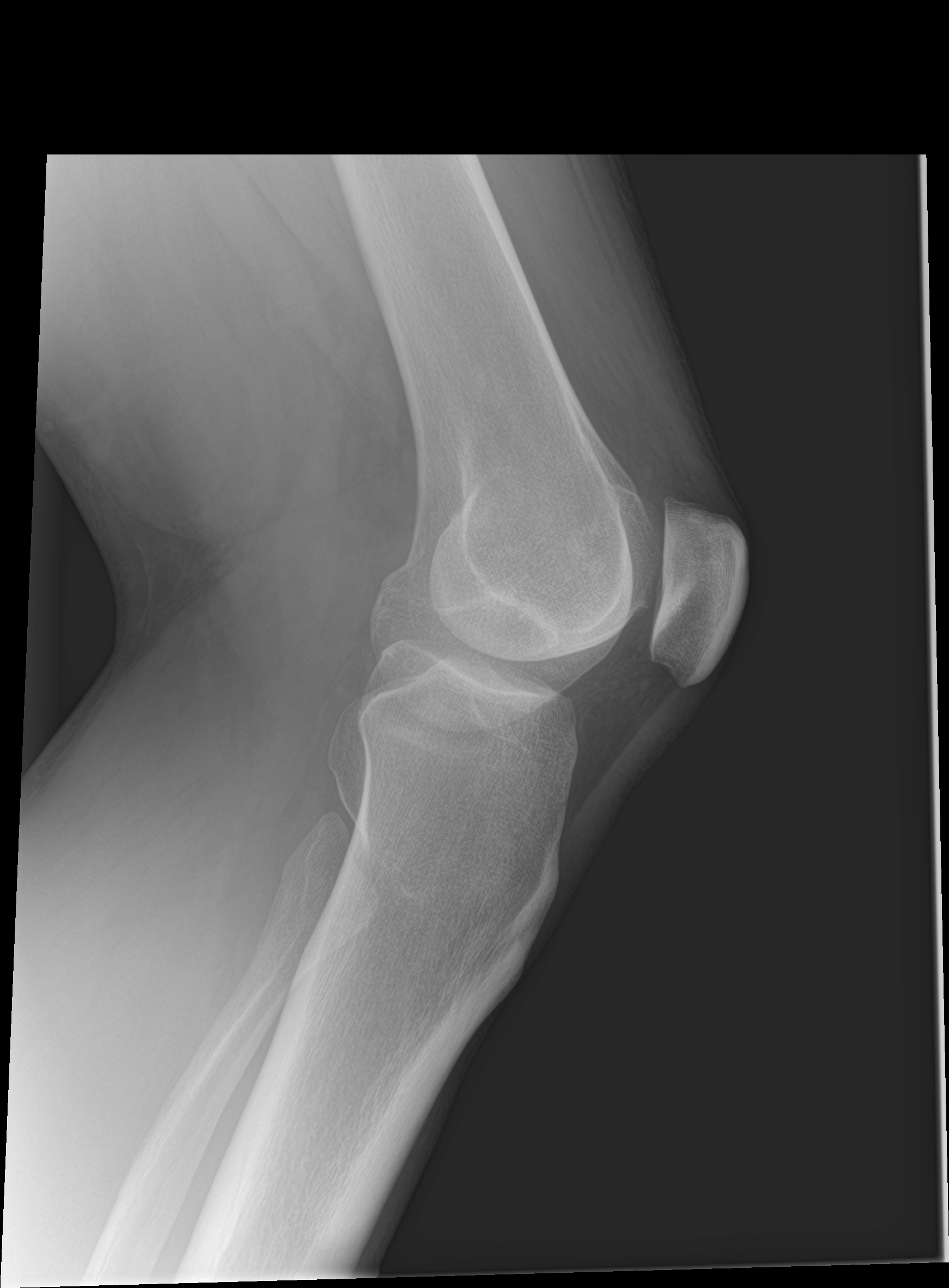

[4 of 4 positions shown; findings below may reference images not displayed]

FINDINGS: No fracture or malalignment. No significant knee effusion. Joint
spaces are maintained.
IMPRESSION: No acute osseous abnormality.

## 2019-09-12 ENCOUNTER — Encounter (HOSPITAL_COMMUNITY): Payer: Self-pay | Admitting: *Deleted

## 2019-09-12 ENCOUNTER — Other Ambulatory Visit: Payer: Self-pay

## 2019-09-12 DIAGNOSIS — Z5321 Procedure and treatment not carried out due to patient leaving prior to being seen by health care provider: Secondary | ICD-10-CM | POA: Insufficient documentation

## 2019-09-12 NOTE — ED Triage Notes (Signed)
Pt with ring to left finger that he placed on 3 days ago, swelling noted to finger and pt unable to remove ring.

## 2019-09-13 ENCOUNTER — Emergency Department (HOSPITAL_COMMUNITY)
Admission: EM | Admit: 2019-09-13 | Discharge: 2019-09-13 | Disposition: A | Discharge status: Home / Self Care | Payer: Self-pay | Attending: Emergency Medicine | Admitting: Emergency Medicine

## 2022-11-05 ENCOUNTER — Other Ambulatory Visit: Payer: Self-pay

## 2022-11-05 ENCOUNTER — Emergency Department (HOSPITAL_COMMUNITY): Payer: Self-pay

## 2022-11-05 ENCOUNTER — Emergency Department (HOSPITAL_COMMUNITY)
Admission: EM | Admit: 2022-11-05 | Discharge: 2022-11-06 | Disposition: A | Discharge status: Home / Self Care | Payer: Self-pay | Attending: Emergency Medicine | Admitting: Emergency Medicine

## 2022-11-05 DIAGNOSIS — R051 Acute cough: Secondary | ICD-10-CM | POA: Insufficient documentation

## 2022-11-05 MED ORDER — HYDROCODONE-ACETAMINOPHEN 5-325 MG PO TABS
1.0000 | ORAL_TABLET | Freq: Once | ORAL | Status: AC
  Administered 2022-11-06: 1 via ORAL
  Filled 2022-11-05: qty 1

## 2022-11-05 MED ORDER — DEXAMETHASONE 4 MG PO TABS
8.0000 mg | ORAL_TABLET | Freq: Once | ORAL | Status: AC
  Administered 2022-11-06: 8 mg via ORAL
  Filled 2022-11-05: qty 2

## 2022-11-05 MED ORDER — IPRATROPIUM-ALBUTEROL 0.5-2.5 (3) MG/3ML IN SOLN
3.0000 mL | Freq: Once | RESPIRATORY_TRACT | Status: AC
  Administered 2022-11-06: 3 mL via RESPIRATORY_TRACT
  Filled 2022-11-05: qty 3

## 2022-11-05 NOTE — ED Triage Notes (Signed)
Pt he has been sick for over a month with a productive cough that hasn't improved. States he thinks he has developed pneumonia.

## 2022-11-06 MED ORDER — AZITHROMYCIN 250 MG PO TABS: 250.0000 mg | ORAL_TABLET | Freq: Every day | ORAL | 0 refills | Status: AC

## 2022-11-06 NOTE — ED Provider Notes (Signed)
[facility] EMERGENCY DEPARTMENT Provider Note   CSN: [id number] Arrival date & time: 11/05/22  1823     History  Chief Complaint  Patient presents with   Cough    Charles White is a 46 y.o. male.  HPI Patient presents for cough.  He reports has had gradually worsening cough over the past month.  He reports white foamy sputum, no hemoptysis.  He reports he is having significant right upper chest and right upper back pain with cough and deep breathing.  No fevers or vomiting.  He reports shortness of breath with cough.  He is a current smoker.  He has no other medical conditions.  No recent travel or surgery.  No history of CAD/VTE.  No lower extremity edema    Home Medications Prior to Admission medications   Medication Sig Start Date End Date Taking? Authorizing Provider  azithromycin (ZITHROMAX) 250 MG tablet Take 1 tablet (250 mg total) by mouth daily. Take first 2 tablets together, then 1 every day until finished. 11/06/22  Yes Ripley Fraise, MD      Allergies    Patient has no known allergies.    Review of Systems   Review of Systems  Constitutional:  Negative for fever.  Respiratory:  Positive for cough.     Physical Exam Updated Vital Signs BP (!) 161/115   Pulse 93   Temp 98.2 F (36.8 C) (Oral)   Resp 20   Ht 1.803 m (5\' 11" )   Wt 99.8 kg   SpO2 96%   BMI 30.68 kg/m  Physical Exam CONSTITUTIONAL: Well developed/well nourished HEAD: Normocephalic/atraumatic EYES: EOMI/PERRL ENMT: Mucous membranes moist NECK: supple no meningeal signs SPINE/BACK:entire spine nontender CV: S1/S2 noted, no murmurs/rubs/gallops noted LUNGS: Scattered wheezing, no acute distress, no Rales Chest-exquisite tenderness noted to the right upper chest, no crepitus. ABDOMEN: soft, nontender NEURO: Pt is awake/alert/appropriate, moves all extremitiesx4.  No facial droop.   EXTREMITIES: pulses normal/equal, full ROM, no lower extremity edema SKIN: warm, color normal PSYCH:  no abnormalities of mood noted, alert and oriented to situation  ED Results / Procedures / Treatments   Labs (all labs ordered are listed, but only abnormal results are displayed) Labs Reviewed - No data to display  EKG None  Radiology DG Chest 2 View  Result Date: 11/05/2022 CLINICAL DATA:  Chest pain with cough for 1 month. EXAM: CHEST - 2 VIEW COMPARISON:  Chest radiograph 09/23/2017 FINDINGS: The heart size and mediastinal contours are within normal limits. Both lungs are clear. No pleural effusion or pneumothorax. Anterior wedging of a few distal thoracic vertebral bodies, unchanged compared to prior exam. IMPRESSION: No acute cardiopulmonary abnormality. Electronically Signed   By: Ileana Roup M.D.   On: 11/05/2022 19:46    Procedures Procedures    Medications Ordered in ED Medications  ipratropium-albuterol (DUONEB) 0.5-2.5 (3) MG/3ML nebulizer solution 3 mL (3 mLs Nebulization Given 11/06/22 0036)  dexamethasone (DECADRON) tablet 8 mg (8 mg Oral Given 11/06/22 0035)  HYDROcodone-acetaminophen (NORCO/VICODIN) 5-325 MG per tablet 1 tablet (1 tablet Oral Given 11/06/22 0036)    ED Course/ Medical Decision Making/ A&P                           Medical Decision Making Amount and/or Complexity of Data Reviewed Radiology: ordered.  Risk Prescription drug management.   This patient presents to the ED for concern of cough, this involves an extensive number of treatment options, and is  a complaint that carries with it a high risk of complications and morbidity.  The differential diagnosis includes but is not limited to Acute coronary syndrome, pneumonia, acute pulmonary edema, pneumothorax, acute anemia, pulmonary embolism    Comorbidities that complicate the patient evaluation: Patient's presentation is complicated by their history of smoking history  Social Determinants of Health: Patient's lack of insurance and impaired access to primary care  increases the complexity  of managing their presentation  Additional history obtained: Additional history obtained from spouse  Imaging Studies ordered: I ordered imaging studies including X-ray chest   I independently visualized and interpreted imaging which showed no acute findings I agree with the radiologist interpretation  Medicines ordered and prescription drug management: I ordered medication including DuoNeb, Decadron for wheezing Reevaluation of the patient after these medicines showed that the patient    improved  Test Considered: Patient appears PERC negative.  Low suspicion for PE or other acute cause of his chest pain due to reproducible chest pain on exam  Reevaluation: After the interventions noted above, I reevaluated the patient and found that they have :improved  Complexity of problems addressed: Patient's presentation is most consistent with  acute presentation with potential threat to life or bodily function  Disposition: After consideration of the diagnostic results and the patient's response to treatment,  I feel that the patent would benefit from discharge   .           Final Clinical Impression(s) / ED Diagnoses Final diagnoses:  Acute cough    Rx / DC Orders ED Discharge Orders          Ordered    azithromycin (ZITHROMAX) 250 MG tablet  Daily        11/06/22 0200              Zadie Rhine, MD 11/06/22 [phone]

## 2023-02-07 ENCOUNTER — Emergency Department (HOSPITAL_COMMUNITY): Payer: Medicaid Other

## 2023-02-07 ENCOUNTER — Other Ambulatory Visit: Payer: Self-pay

## 2023-02-07 ENCOUNTER — Emergency Department (HOSPITAL_COMMUNITY)
Admission: EM | Admit: 2023-02-07 | Discharge: 2023-02-07 | Disposition: A | Discharge status: Home / Self Care | Payer: Medicaid Other | Attending: Emergency Medicine | Admitting: Emergency Medicine

## 2023-02-07 ENCOUNTER — Encounter (HOSPITAL_COMMUNITY): Payer: Self-pay | Admitting: *Deleted

## 2023-02-07 DIAGNOSIS — Z20822 Contact with and (suspected) exposure to covid-19: Secondary | ICD-10-CM | POA: Insufficient documentation

## 2023-02-07 DIAGNOSIS — J189 Pneumonia, unspecified organism: Secondary | ICD-10-CM

## 2023-02-07 DIAGNOSIS — R0602 Shortness of breath: Secondary | ICD-10-CM | POA: Diagnosis present

## 2023-02-07 LAB — RESP PANEL BY RT-PCR (RSV, FLU A&B, COVID)  RVPGX2
Influenza A by PCR: NEGATIVE
Influenza B by PCR: NEGATIVE
Resp Syncytial Virus by PCR: NEGATIVE
SARS Coronavirus 2 by RT PCR: NEGATIVE

## 2023-02-07 LAB — CBC
HCT: 43 % (ref 39.0–52.0)
Hemoglobin: 14.4 g/dL (ref 13.0–17.0)
MCH: 27.9 pg (ref 26.0–34.0)
MCHC: 33.5 g/dL (ref 30.0–36.0)
MCV: 83.2 fL (ref 80.0–100.0)
Platelets: 326 10*3/uL (ref 150–400)
RBC: 5.17 MIL/uL (ref 4.22–5.81)
RDW: 12.5 % (ref 11.5–15.5)
WBC: 17.7 10*3/uL — ABNORMAL HIGH (ref 4.0–10.5)
nRBC: 0 % (ref 0.0–0.2)

## 2023-02-07 LAB — BASIC METABOLIC PANEL
Anion gap: 9 (ref 5–15)
BUN: 13 mg/dL (ref 6–20)
CO2: 21 mmol/L — ABNORMAL LOW (ref 22–32)
Calcium: 8.6 mg/dL — ABNORMAL LOW (ref 8.9–10.3)
Chloride: 103 mmol/L (ref 98–111)
Creatinine, Ser: 0.93 mg/dL (ref 0.61–1.24)
GFR, Estimated: >60 mL/min (ref >60)
Glucose, Bld: 132 mg/dL — ABNORMAL HIGH (ref 70–99)
Potassium: 3.8 mmol/L (ref 3.5–5.1)
Sodium: 133 mmol/L — ABNORMAL LOW (ref 135–145)

## 2023-02-07 MED ORDER — AMOXICILLIN 500 MG PO CAPS: 500.0000 mg | ORAL_CAPSULE | Freq: Three times a day (TID) | ORAL | 0 refills | Status: AC

## 2023-02-07 NOTE — ED Triage Notes (Signed)
Pt with cough, fever(99-100) and SOB, started a over a week ago.  Cough productive, white to yellow in color per pt. Taking Mucinex OTC. Pt's wife has been sick with same symptoms.  Pt needing a doctor's note for court, that he did not attend today due to symptoms.

## 2023-02-07 NOTE — ED Provider Notes (Signed)
[facility] [place] Provider Note   CSN: [id number] Arrival date & time: 02/07/23  1453     History  Chief Complaint  Patient presents with   Cough    Charles White is a 47 y.o. male with no history of past medical history presents with concern for shortness of breath, fever, cough for 1 and half weeks.  Patient reports productive cough with white, yellow, greenish mucus.  He reports he has been taking Mucinex, Tylenol over-the-counter.  Patient reports wife has been sick with same symptoms.  Patient reports that he missed court today because he was sick.  He reports that he would need a doctor's note before he can return to court.   Cough      Home Medications Prior to Admission medications   Medication Sig Start Date End Date Taking? Authorizing Provider  amoxicillin (AMOXIL) 500 MG capsule Take 1 capsule (500 mg total) by mouth 3 (three) times daily. 02/07/23  Yes Sherod Cisse H, PA-C  azithromycin (ZITHROMAX) 250 MG tablet Take 1 tablet (250 mg total) by mouth daily. Take first 2 tablets together, then 1 every day until finished. 11/06/22   Ripley Fraise, MD      Allergies    Patient has no known allergies.    Review of Systems   Review of Systems  Respiratory:  Positive for cough.   All other systems reviewed and are negative.   Physical Exam Updated Vital Signs BP (!) 128/94 (BP Location: Right Arm)   Pulse (!) 106   Temp 98.2 F (36.8 C) (Oral)   Resp 20   Ht 5\' 11"  (1.803 m)   Wt 99.8 kg   SpO2 94%   BMI 30.68 kg/m  Physical Exam Vitals and nursing note reviewed.  Constitutional:      General: He is not in acute distress.    Appearance: Normal appearance.  HENT:     Head: Normocephalic and atraumatic.  Eyes:     General:        Right eye: No discharge.        Left eye: No discharge.  Cardiovascular:     Rate and Rhythm: Normal rate and regular rhythm.     Heart sounds: No murmur heard.    No  friction rub. No gallop.  Pulmonary:     Effort: Pulmonary effort is normal.     Breath sounds: Normal breath sounds.     Comments: Scattered rhonchi worse in upper lung fields, no wheezing, stridor, rales, or respiratory distress Abdominal:     General: Bowel sounds are normal.     Palpations: Abdomen is soft.  Skin:    General: Skin is warm and dry.     Capillary Refill: Capillary refill takes less than 2 seconds.  Neurological:     Mental Status: He is alert and oriented to person, place, and time.  Psychiatric:        Mood and Affect: Mood normal.        Behavior: Behavior normal.     ED Results / Procedures / Treatments   Labs (all labs ordered are listed, but only abnormal results are displayed) Labs Reviewed  CBC - Abnormal; Notable for the following components:      Result Value   WBC 17.7 (*)    All other components within normal limits  BASIC METABOLIC PANEL - Abnormal; Notable for the following components:   Sodium 133 (*)    CO2 21 (*)  Glucose, Bld 132 (*)    Calcium 8.6 (*)    All other components within normal limits  RESP PANEL BY RT-PCR (RSV, FLU A&B, COVID)  [id number]    EKG None  Radiology DG Chest Portable 1 View  Result Date: 02/07/2023 CLINICAL DATA:  Pt with cough, fever(99-100) and SOB, started a over a week ago. Cough productive. EXAM: PORTABLE CHEST - 1 VIEW COMPARISON:  11/05/2022 FINDINGS: Some increase in scattered interstitial opacities in the lung bases left greater than right. No confluent airspace disease or overt edema. Heart size and mediastinal contours are within normal limits. No effusion. Visualized bones unremarkable. IMPRESSION: Increasing bibasilar interstitial opacities, left greater than right. Electronically Signed   By: Lucrezia Europe M.D.   On: 02/07/2023 15:32    Procedures Procedures    Medications Ordered in ED Medications - No data to display  ED Course/ Medical Decision Making/ A&P                              Medical Decision Making Amount and/or Complexity of Data Reviewed Labs: ordered. Radiology: ordered.   This patient is a 47 y.o. male  who presents to the ED for concern of cough, fever, congestion, shob.   Differential diagnoses prior to evaluation: The emergent differential diagnosis includes, but is not limited to,  asthma exacerbation, COPD exacerbation, acute upper respiratory infection, acute bronchitis, chronic bronchitis, interstitial lung disease, ARDS, PE, pneumonia, atypical ACS, carbon monoxide poisoning, spontaneous pneumothorax, new CHF vs CHF exacerbation, versus other . This is not an exhaustive differential.   Past Medical History / Co-morbidities: Noncontributory past medical history  Physical Exam: Physical exam performed. The pertinent findings include: Patient somewhat ill-appearing, he has mild cardio on arrival, he is afebrile during our assessment.  He has borderline tachypnea, 20 respirations per minute, 94% oxygen saturation on room air.  Lab Tests/Imaging studies: I personally interpreted labs/imaging and the pertinent results include: BMP notable for mild hyponatremia, sodium 133, mild bicarb deficit, CO2 21.  He does have a significant leukocytosis, white blood cell 17.7.  He denies any recent steroid usage.  I independently interpreted plain film chest x-ray which shows some bilateral bibasilar interstitial opacities, left greater than right.  I agree with the radiologist interpretation.  RVP negative for COVID, flu, RSV.   Medications: I ordered medication including will discharge patient on amoxicillin 3 times daily for a week for community-acquired pneumonia, encourage close PCP follow-up.   Disposition: After consideration of the diagnostic results and the patients response to treatment, I feel that patient's symptoms are consistent with community-acquired pneumonia, will discharge with amoxicillin, encourage close PCP follow-up.   emergency department  workup does not suggest an emergent condition requiring admission or immediate intervention beyond what has been performed at this time. The plan is: as above. The patient is safe for discharge and has been instructed to return immediately for worsening symptoms, change in symptoms or any other concerns.  Final Clinical Impression(s) / ED Diagnoses Final diagnoses:  Community acquired pneumonia, unspecified laterality    Rx / DC Orders ED Discharge Orders          Ordered    amoxicillin (AMOXIL) 500 MG capsule  3 times daily        02/07/23 1704              Jeaninne Lodico, Jackalyn Lombard 02/07/23 1713    Dorie Rank, MD 02/08/23 [phone]
